# Patient Record
Sex: Female | Born: 1990 | Race: Black or African American | Hispanic: No
Health system: Southern US, Community
[De-identification: ages and names within clinical notes are randomized; demographics above are authoritative.]

---

## 2021-04-18 ENCOUNTER — Emergency Department (HOSPITAL_COMMUNITY)
Admission: EM | Admit: 2021-04-18 | Discharge: 2021-04-18 | Disposition: A | Discharge status: Home / Self Care | Payer: Medicaid Other | Attending: Emergency Medicine | Admitting: Emergency Medicine

## 2021-04-18 ENCOUNTER — Emergency Department (HOSPITAL_COMMUNITY): Payer: Medicaid Other

## 2021-04-18 ENCOUNTER — Encounter (HOSPITAL_COMMUNITY): Payer: Self-pay | Admitting: Emergency Medicine

## 2021-04-18 ENCOUNTER — Other Ambulatory Visit: Payer: Self-pay

## 2021-04-18 DIAGNOSIS — W540XXA Bitten by dog, initial encounter: Secondary | ICD-10-CM | POA: Insufficient documentation

## 2021-04-18 DIAGNOSIS — Z23 Encounter for immunization: Secondary | ICD-10-CM | POA: Insufficient documentation

## 2021-04-18 DIAGNOSIS — S51832A Puncture wound without foreign body of left forearm, initial encounter: Secondary | ICD-10-CM | POA: Insufficient documentation

## 2021-04-18 DIAGNOSIS — S61206A Unspecified open wound of right little finger without damage to nail, initial encounter: Secondary | ICD-10-CM | POA: Insufficient documentation

## 2021-04-18 MED ORDER — ACETAMINOPHEN 500 MG PO TABS
1000.0000 mg | ORAL_TABLET | Freq: Once | ORAL | Status: AC
  Administered 2021-04-18: 1000 mg via ORAL
  Filled 2021-04-18: qty 2

## 2021-04-18 MED ORDER — OXYCODONE HCL 5 MG PO TABS
5.0000 mg | ORAL_TABLET | Freq: Once | ORAL | Status: AC
  Administered 2021-04-18: 5 mg via ORAL
  Filled 2021-04-18: qty 1

## 2021-04-18 MED ORDER — AMOXICILLIN-POT CLAVULANATE 875-125 MG PO TABS
1.0000 | ORAL_TABLET | Freq: Once | ORAL | Status: AC
  Administered 2021-04-18: 1 via ORAL
  Filled 2021-04-18: qty 1

## 2021-04-18 MED ORDER — AMOXICILLIN-POT CLAVULANATE 875-125 MG PO TABS: 1.0000 | ORAL_TABLET | Freq: Two times a day (BID) | ORAL | 0 refills | Status: AC

## 2021-04-18 MED ORDER — TETANUS-DIPHTH-ACELL PERTUSSIS 5-2.5-18.5 LF-MCG/0.5 IM SUSY
0.5000 mL | PREFILLED_SYRINGE | Freq: Once | INTRAMUSCULAR | Status: AC
  Administered 2021-04-18: 0.5 mL via INTRAMUSCULAR
  Filled 2021-04-18: qty 0.5

## 2021-04-18 NOTE — ED Notes (Signed)
Pt changed her mind and spit the Oxycodone out. Thrown in sharps container.

## 2021-04-18 NOTE — ED Notes (Signed)
Irrigated wounds with NS and Betadine per MD request. Dressed with non stick pads and wrapped with kling. Wound care instructions and supplies given to patient.

## 2021-04-18 NOTE — ED Notes (Signed)
Pt transported to Xray via stretcher in stable condition  

## 2021-04-18 NOTE — ED Triage Notes (Signed)
BIB EMS for dog bite and possibility of left forearm fracture. Pt was bitten by her dog in left arm and right hand. Per pt's report her friend somehow grabbed her arm. EMS said the while fire dept was putting a splint, her arm position suggest arm fx. Good radial pulse, good color. Pt was able to wiggle hands. 200 mcg fentanyl on board. Pt very anxioius.

## 2021-04-18 NOTE — ED Provider Notes (Signed)
[facility] EMERGENCY DEPARTMENT Provider Note   CSN: [id number] Arrival date & time: 04/18/21  2122     History Chief Complaint  Patient presents with  . Animal Bite  . Arm Injury    Shelly Lowe is a 30 y.o. female.  30 yo F with a cc of a dog bite.  Patient has an aggressive dog at home.  Was trying to break up a fight between dogs, got bit on the right hand and left forearm.  EMS felt like her forearm was likely broken.  Brought here for evaluation.  Patient unsure of last tdap  The history is provided by the patient.  Animal Bite Contact animal:  Dog Time since incident:  2 hours Pain details:    Quality:  Aching and sore   Severity:  Severe   Timing:  Constant   Progression:  Unchanged Incident location:  Home Provoked: unprovoked   Notifications:  None Animal's rabies vaccination status:  Up to date Animal in possession: yes   Tetanus status:  Unknown Relieved by:  Nothing Worsened by:  Nothing Ineffective treatments:  None tried Associated symptoms: no fever   Arm Injury Associated symptoms: no fever        History reviewed. No pertinent past medical history.  There are no problems to display for this patient.   History reviewed. No pertinent surgical history.   OB History   No obstetric history on file.     No family history on file.     Home Medications Prior to Admission medications   Medication Sig Start Date End Date Taking? Authorizing Provider  amoxicillin-clavulanate (AUGMENTIN) 875-125 MG tablet Take 1 tablet by mouth 2 (two) times daily. One po bid x 7 days 04/18/21  Yes Melene Plan, DO    Allergies    Patient has no known allergies.  Review of Systems   Review of Systems  Constitutional: Negative for chills and fever.  HENT: Negative for congestion and rhinorrhea.   Eyes: Negative for redness and visual disturbance.  Respiratory: Negative for shortness of breath and wheezing.   Cardiovascular: Negative for chest  pain and palpitations.  Gastrointestinal: Negative for nausea and vomiting.  Genitourinary: Negative for dysuria and urgency.  Musculoskeletal: Positive for arthralgias and myalgias.  Skin: Positive for wound. Negative for pallor.  Neurological: Negative for dizziness and headaches.    Physical Exam Updated Vital Signs BP (!) 124/92 (BP Location: Right Arm)   Pulse 86   Temp 98.1 F (36.7 C) (Oral)   Resp 18   Ht 5\' 4"  (1.626 m)   Wt 54.4 kg   LMP 04/18/2021 (Approximate)   SpO2 100%   BMI 20.60 kg/m   Physical Exam Vitals and nursing note reviewed.  Constitutional:      General: She is not in acute distress.    Appearance: She is well-developed. She is not diaphoretic.  HENT:     Head: Normocephalic and atraumatic.  Eyes:     Pupils: Pupils are equal, round, and reactive to light.  Cardiovascular:     Rate and Rhythm: Normal rate and regular rhythm.     Heart sounds: No murmur heard. No friction rub. No gallop.   Pulmonary:     Effort: Pulmonary effort is normal.     Breath sounds: No wheezing or rales.  Abdominal:     General: There is no distension.     Palpations: Abdomen is soft.     Tenderness: There is no abdominal tenderness.  Musculoskeletal:        General: No tenderness.     Cervical back: Normal range of motion and neck supple.     Comments: Puncture wound over distal fifth metacarpal on the right.  No obvious bony tenderness.   Puncture wounds to the left forearm, no significant edema.  PMS intact distally.   Skin:    General: Skin is warm and dry.  Neurological:     Mental Status: She is alert and oriented to person, place, and time.  Psychiatric:        Behavior: Behavior normal.     ED Results / Procedures / Treatments   Labs (all labs ordered are listed, but only abnormal results are displayed) Labs Reviewed - No data to display  EKG None  Radiology DG Forearm Left  Result Date: 04/18/2021 CLINICAL DATA:  pain, dog bite EXAM: LEFT  FOREARM - 2 VIEW COMPARISON:  None. FINDINGS: There is no acute osseous abnormality. There is forearm soft tissue swelling and possible subcutaneous gas. There is no evidence of radiopaque foreign body. IMPRESSION: No acute osseous abnormality or radiopaque foreign body. Soft tissue swelling of the forearm with possible soft tissue gas. Electronically Signed   By: Caprice Renshaw   On: 04/18/2021 11:25   DG Hand Complete Right  Result Date: 04/18/2021 CLINICAL DATA:  pain, dog bite EXAM: RIGHT HAND - COMPLETE 3+ VIEW COMPARISON:  None. FINDINGS: No acute osseous abnormality. No significant degenerative change. Normal alignment. Possible small soft tissue defect adjacent to the fifth metacarpal. IMPRESSION: No acute osseous abnormality or radiopaque foreign body in the right hand. Possible small soft tissue defect adjacent to the fifth metacarpal. Electronically Signed   By: Caprice Renshaw   On: 04/18/2021 11:26    Procedures Procedures   Medications Ordered in ED Medications  acetaminophen (TYLENOL) tablet 1,000 mg (1,000 mg Oral Given 04/18/21 1022)  oxyCODONE (Oxy IR/ROXICODONE) immediate release tablet 5 mg (5 mg Oral Given 04/18/21 1022)  Tdap (BOOSTRIX) injection 0.5 mL (0.5 mLs Intramuscular Given 04/18/21 1022)  amoxicillin-clavulanate (AUGMENTIN) 875-125 MG per tablet 1 tablet (1 tablet Oral Given 04/18/21 1128)    ED Course  I have reviewed the triage vital signs and the nursing notes.  Pertinent labs & imaging results that were available during my care of the patient were reviewed by me and considered in my medical decision making (see chart for details).    MDM Rules/Calculators/A&P                          30 yo F with a cc of being bit by her dog.  Pain to the right hand and left forearm.  Some concern with EMS of possible open fracture, will obtain plain films.   Plain films viewed by me negative for fracture.  Irrigated here in the ED.  Started on Augmentin.  PCP follow-up.  11:55 AM:   I have discussed the diagnosis/risks/treatment options with the patient and caregiver and believe the pt to be eligible for discharge home to follow-up with PCP. We also discussed returning to the ED immediately if new or worsening sx occur. We discussed the sx which are most concerning (e.g., sudden worsening pain, fever, inability to tolerate by mouth, redness, drainage) that necessitate immediate return. Medications administered to the patient during their visit and any new prescriptions provided to the patient are listed below.  Medications given during this visit Medications  acetaminophen (TYLENOL) tablet 1,000 mg (  1,000 mg Oral Given 04/18/21 1022)  oxyCODONE (Oxy IR/ROXICODONE) immediate release tablet 5 mg (5 mg Oral Given 04/18/21 1022)  Tdap (BOOSTRIX) injection 0.5 mL (0.5 mLs Intramuscular Given 04/18/21 1022)  amoxicillin-clavulanate (AUGMENTIN) 875-125 MG per tablet 1 tablet (1 tablet Oral Given 04/18/21 1128)     The patient appears reasonably screen and/or stabilized for discharge and I doubt any other medical condition or other [facility] requiring further screening, evaluation, or treatment in the ED at this time prior to discharge.   Final Clinical Impression(s) / ED Diagnoses Final diagnoses:  Dog bite, initial encounter    Rx / DC Orders ED Discharge Orders         Ordered    amoxicillin-clavulanate (AUGMENTIN) 875-125 MG tablet  2 times daily        04/18/21 1152           Melene Plan, DO 04/18/21 1155

## 2021-04-18 NOTE — Discharge Instructions (Addendum)
Take 4 over the counter ibuprofen tablets 3 times a day or 2 over-the-counter naproxen tablets twice a day for pain. Also take tylenol 1000mg(2 extra strength) four times a day.   Return for redness, drainage, fever 

## 2021-11-15 IMAGING — DX DG FOREARM 2V*L*
2 series · 2 of 2 positions shown · non-contrast
Comparison: None.

CLINICAL DATA: pain, dog bite

EXAM:
LEFT FOREARM - 2 VIEW

[forearm ap]
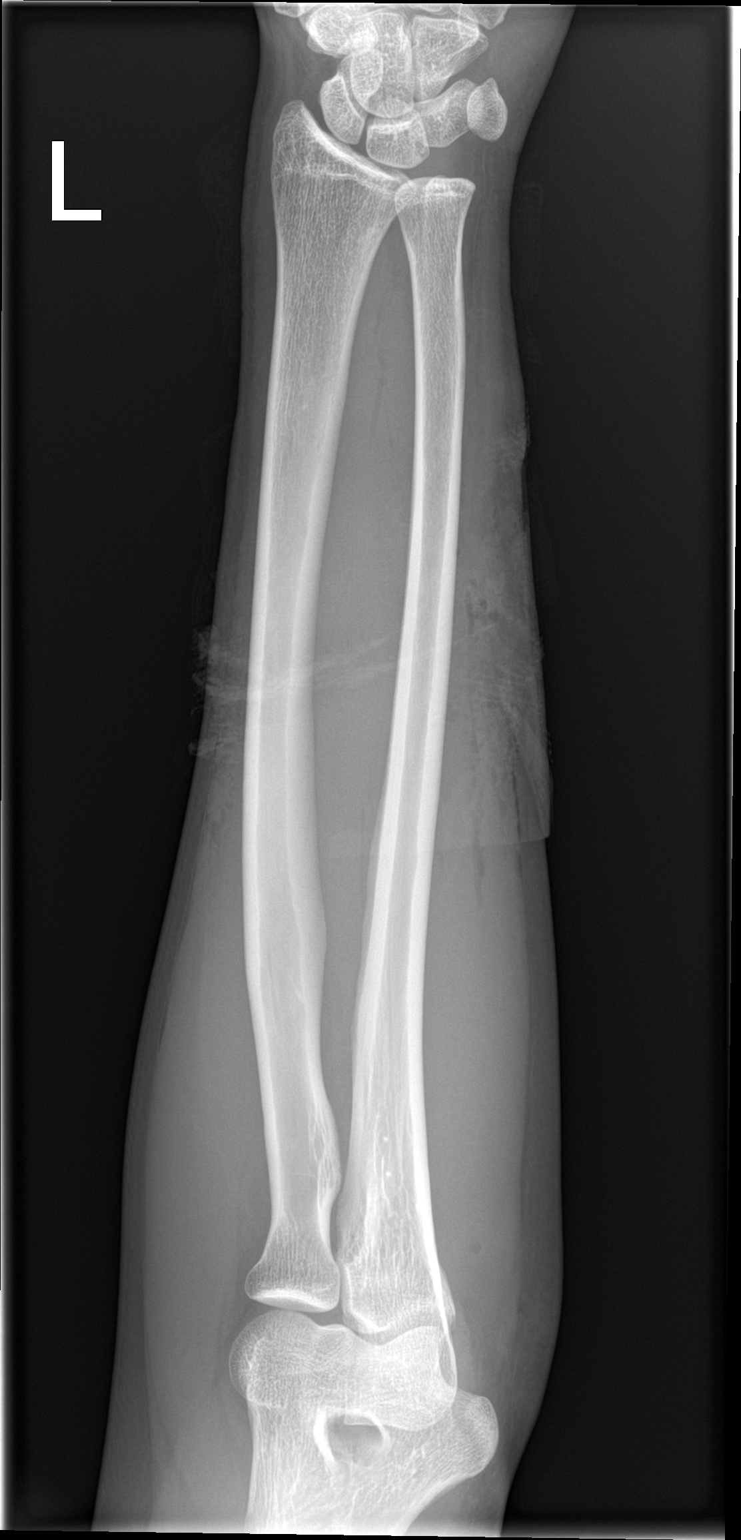

[forearm lat]
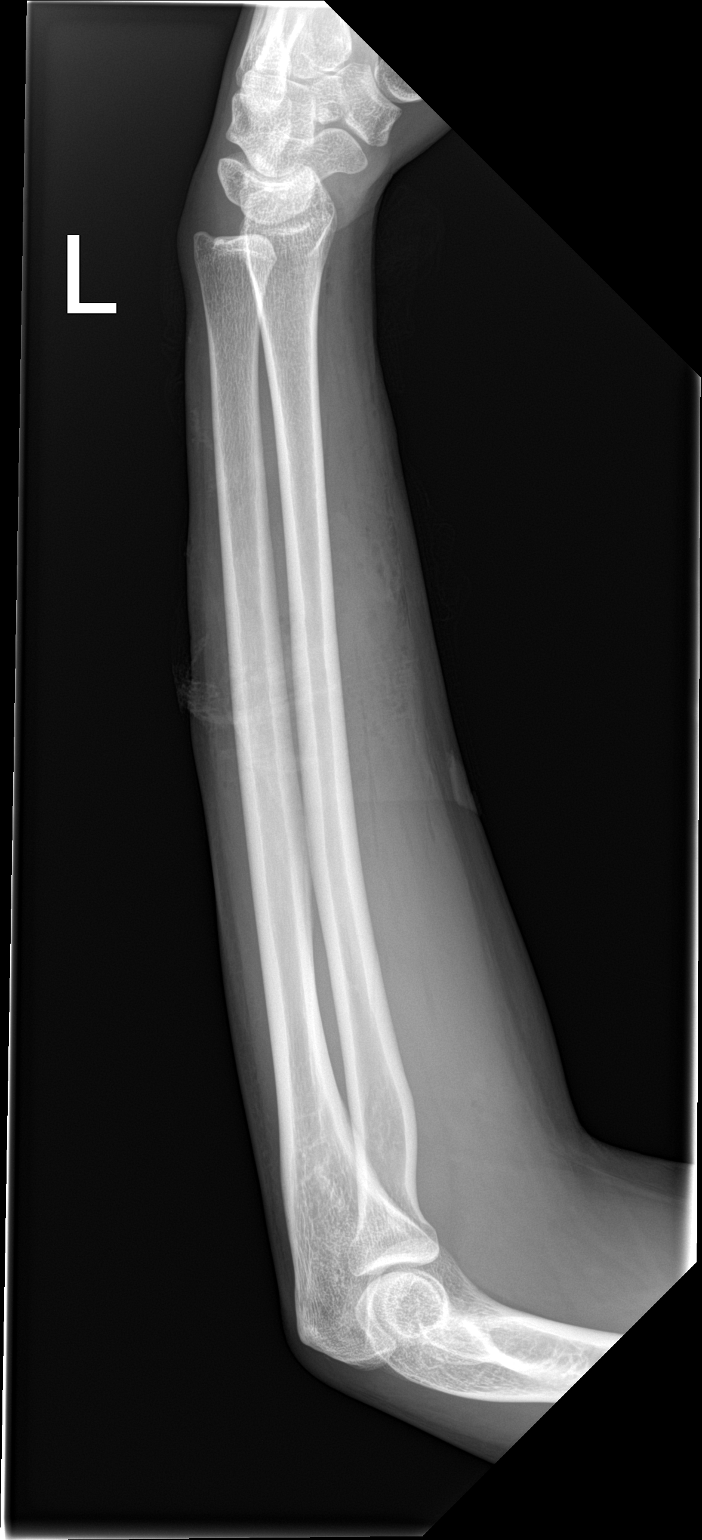

[2 of 2 positions shown; findings below may reference images not displayed]

FINDINGS: There is no acute osseous abnormality. There is forearm soft tissue
swelling and possible subcutaneous gas. There is no evidence of
radiopaque foreign body.
IMPRESSION: No acute osseous abnormality or radiopaque foreign body. Soft tissue
swelling of the forearm with possible soft tissue gas.
